# Patient Record
Sex: Male | Born: 1975 | Race: Black or African American | Hispanic: No | Marital: Single | Smoking: Current every day smoker
Health system: Southern US, Community
[De-identification: ages and names within clinical notes are randomized; demographics above are authoritative.]

## PROBLEM LIST (undated history)

## (undated) DIAGNOSIS — I10 Essential (primary) hypertension: Secondary | ICD-10-CM

---

## 2003-12-11 ENCOUNTER — Emergency Department: Payer: Self-pay | Admitting: Internal Medicine

## 2004-02-15 ENCOUNTER — Emergency Department: Payer: Self-pay | Admitting: Internal Medicine

## 2004-02-17 ENCOUNTER — Emergency Department: Payer: Self-pay | Admitting: Emergency Medicine

## 2004-06-08 ENCOUNTER — Emergency Department: Payer: Self-pay | Admitting: Emergency Medicine

## 2004-07-28 ENCOUNTER — Emergency Department: Payer: Self-pay | Admitting: General Practice

## 2004-07-28 ENCOUNTER — Other Ambulatory Visit: Payer: Self-pay

## 2005-11-09 ENCOUNTER — Emergency Department (HOSPITAL_COMMUNITY): Admission: EM | Admit: 2005-11-09 | Discharge: 2005-11-10 | Payer: Self-pay | Admitting: Emergency Medicine

## 2006-01-10 ENCOUNTER — Emergency Department: Payer: Self-pay | Admitting: Emergency Medicine

## 2006-02-21 ENCOUNTER — Emergency Department: Payer: Self-pay | Admitting: Emergency Medicine

## 2007-08-10 ENCOUNTER — Emergency Department: Payer: Self-pay | Admitting: Emergency Medicine

## 2008-06-09 ENCOUNTER — Emergency Department: Payer: Self-pay | Admitting: Unknown Physician Specialty

## 2008-10-19 ENCOUNTER — Emergency Department: Payer: Self-pay | Admitting: Emergency Medicine

## 2009-12-12 ENCOUNTER — Emergency Department: Payer: Self-pay | Admitting: Emergency Medicine

## 2009-12-15 ENCOUNTER — Emergency Department: Payer: Self-pay | Admitting: Emergency Medicine

## 2009-12-20 ENCOUNTER — Emergency Department: Payer: Self-pay | Admitting: Emergency Medicine

## 2010-05-30 ENCOUNTER — Emergency Department: Payer: Self-pay | Admitting: Emergency Medicine

## 2010-09-24 ENCOUNTER — Emergency Department: Payer: Self-pay

## 2011-11-04 ENCOUNTER — Emergency Department (HOSPITAL_COMMUNITY)
Admission: EM | Admit: 2011-11-04 | Discharge: 2011-11-05 | Disposition: A | Discharge status: Home / Self Care | Payer: Self-pay | Attending: Emergency Medicine | Admitting: Emergency Medicine

## 2011-11-04 ENCOUNTER — Encounter (HOSPITAL_COMMUNITY): Payer: Self-pay | Admitting: Emergency Medicine

## 2011-11-04 DIAGNOSIS — J029 Acute pharyngitis, unspecified: Secondary | ICD-10-CM | POA: Insufficient documentation

## 2011-11-04 DIAGNOSIS — L988 Other specified disorders of the skin and subcutaneous tissue: Secondary | ICD-10-CM | POA: Insufficient documentation

## 2011-11-04 DIAGNOSIS — E876 Hypokalemia: Secondary | ICD-10-CM | POA: Insufficient documentation

## 2011-11-04 DIAGNOSIS — N342 Other urethritis: Secondary | ICD-10-CM | POA: Insufficient documentation

## 2011-11-04 DIAGNOSIS — I10 Essential (primary) hypertension: Secondary | ICD-10-CM | POA: Insufficient documentation

## 2011-11-04 DIAGNOSIS — Z79899 Other long term (current) drug therapy: Secondary | ICD-10-CM | POA: Insufficient documentation

## 2011-11-04 DIAGNOSIS — R112 Nausea with vomiting, unspecified: Secondary | ICD-10-CM | POA: Insufficient documentation

## 2011-11-04 DIAGNOSIS — R197 Diarrhea, unspecified: Secondary | ICD-10-CM | POA: Insufficient documentation

## 2011-11-04 DIAGNOSIS — R369 Urethral discharge, unspecified: Secondary | ICD-10-CM | POA: Insufficient documentation

## 2011-11-04 HISTORY — DX: Essential (primary) hypertension: I10

## 2011-11-04 NOTE — ED Notes (Signed)
Pt reports since Monday having nausea, vomited on Monday, but has not since, also reports diarrhea; denies abd pain or any other pain

## 2011-11-05 LAB — RAPID STREP SCREEN (MED CTR MEBANE ONLY): Streptococcus, Group A Screen (Direct): NEGATIVE

## 2011-11-05 LAB — CBC WITH DIFFERENTIAL/PLATELET
Basophils Relative: 0 % (ref 0–1)
Eosinophils Absolute: 0 10*3/uL (ref 0.0–0.7)
HCT: 41.2 % (ref 39.0–52.0)
Hemoglobin: 14.3 g/dL (ref 13.0–17.0)
MCH: 32.9 pg (ref 26.0–34.0)
MCHC: 34.7 g/dL (ref 30.0–36.0)
Monocytes Absolute: 0.4 10*3/uL (ref 0.1–1.0)
Monocytes Relative: 6 % (ref 3–12)
Neutro Abs: 4.2 10*3/uL (ref 1.7–7.7)

## 2011-11-05 LAB — URINALYSIS, ROUTINE W REFLEX MICROSCOPIC
Glucose, UA: NEGATIVE mg/dL
Ketones, ur: 15 mg/dL — AB
pH: 6 (ref 5.0–8.0)

## 2011-11-05 LAB — COMPREHENSIVE METABOLIC PANEL
AST: 15 U/L (ref 0–37)
Albumin: 3.5 g/dL (ref 3.5–5.2)
Alkaline Phosphatase: 76 U/L (ref 39–117)
Chloride: 104 mEq/L (ref 96–112)
Potassium: 3 mEq/L — ABNORMAL LOW (ref 3.5–5.1)
Total Bilirubin: 0.8 mg/dL (ref 0.3–1.2)

## 2011-11-05 LAB — URINE MICROSCOPIC-ADD ON

## 2011-11-05 MED ORDER — DEXTROSE 5 % IV SOLN
1.0000 g | Freq: Once | INTRAVENOUS | Status: AC
  Administered 2011-11-05: 1 g via INTRAVENOUS
  Filled 2011-11-05: qty 10

## 2011-11-05 MED ORDER — SODIUM CHLORIDE 0.9 % IV BOLUS (SEPSIS)
1000.0000 mL | Freq: Once | INTRAVENOUS | Status: AC
  Administered 2011-11-05: 1000 mL via INTRAVENOUS

## 2011-11-05 MED ORDER — ONDANSETRON HCL 4 MG PO TABS: 4.0000 mg | ORAL_TABLET | Freq: Four times a day (QID) | ORAL | Status: DC

## 2011-11-05 MED ORDER — POTASSIUM CHLORIDE CRYS ER 20 MEQ PO TBCR
40.0000 meq | EXTENDED_RELEASE_TABLET | Freq: Once | ORAL | Status: AC
  Administered 2011-11-05: 40 meq via ORAL
  Filled 2011-11-05: qty 2

## 2011-11-05 MED ORDER — ONDANSETRON HCL 4 MG/2ML IJ SOLN
4.0000 mg | Freq: Once | INTRAMUSCULAR | Status: AC
  Administered 2011-11-05: 4 mg via INTRAVENOUS
  Filled 2011-11-05: qty 2

## 2011-11-05 MED ORDER — AZITHROMYCIN 1 G PO PACK
1.0000 g | PACK | Freq: Once | ORAL | Status: AC
  Administered 2011-11-05: 1 g via ORAL
  Filled 2011-11-05: qty 1

## 2011-11-05 NOTE — ED Notes (Signed)
The patient is AOx4 and comfortable with his discharge instructions. 

## 2011-11-05 NOTE — ED Provider Notes (Signed)
History     CSN: [id number]  Arrival date & time 11/04/11  2312   First MD Initiated Contact with Patient 11/04/11 2349      Chief Complaint  Patient presents with  . Emesis    (Consider location/radiation/quality/duration/timing/severity/associated sxs/prior treatment) HPI 36 year male presents to emergency room complaining of nausea vomiting and diarrhea since Monday. He denies any travel, no unusual foods, no fever no abdominal pain. He has had some sore throat and penile discharge. Patient recently engaged in sexual contact, and thinks he might have a STD. No prior history of STI's, HIV. He denies any pain with urination. Patient has been unable take his medications for the last few days do to nausea. Patient reports he feels dehydrated, reports his urine has been dark.  Past Medical History  Diagnosis Date  . Hypertension     History reviewed. No pertinent past surgical history.  History reviewed. No pertinent family history.  History  Substance Use Topics  . Smoking status: Current Every Day Smoker -- 0.2 packs/day    Types: Cigarettes  . Smokeless tobacco: Not on file  . Alcohol Use: Yes     two times per week      Review of Systems  All other systems reviewed and are negative.    Allergies  Review of patient's allergies indicates no known allergies.  Home Medications   Current Outpatient Rx  Name Route Sig Dispense Refill  . HYDROCHLOROTHIAZIDE 25 MG PO TABS Oral Take 25 mg by mouth daily.    Marland Kitchen LISINOPRIL 40 MG PO TABS Oral Take 40 mg by mouth daily.    Marland Kitchen OMEPRAZOLE 20 MG PO CPDR Oral Take 20 mg by mouth 2 (two) times daily.    Marland Kitchen ONDANSETRON HCL 4 MG PO TABS Oral Take 1 tablet (4 mg total) by mouth every 6 (six) hours. PRN nausea 12 tablet 0    BP 129/81  Pulse 76  Temp 97.6 F (36.4 C) (Oral)  Resp 17  Ht 5\' 6"  (1.676 m)  Wt 184 lb (83.462 kg)  BMI 29.70 kg/m2  SpO2 97%  Physical Exam  Nursing note and vitals reviewed. Constitutional: He is  oriented to person, place, and time. He appears well-developed and well-nourished.  HENT:  Head: Normocephalic and atraumatic.  Nose: Nose normal.  Mouth/Throat: Oropharynx is clear and moist. No oropharyngeal exudate (No exudate, but posterior pharynx is erythematous, no vesicles seen).  Eyes: Conjunctivae normal and EOM are normal. Pupils are equal, round, and reactive to light.  Neck: Normal range of motion. Neck supple. No JVD present. No tracheal deviation present. No thyromegaly present.  Cardiovascular: Normal rate, regular rhythm, normal heart sounds and intact distal pulses.  Exam reveals no gallop and no friction rub.   No murmur heard. Pulmonary/Chest: Effort normal and breath sounds normal. No stridor. No respiratory distress. He has no wheezes. He has no rales. He exhibits no tenderness.  Abdominal: Soft. Bowel sounds are normal. He exhibits no distension and no mass. There is no tenderness. There is no rebound and no guarding.  Genitourinary: No penile tenderness.       Yellow white discharge noted from penis. Nonpainful papule noted to head of penis. Patient is unsure how long it has been present.  Musculoskeletal: Normal range of motion. He exhibits no edema and no tenderness.  Lymphadenopathy:    He has no cervical adenopathy.  Neurological: He is alert and oriented to person, place, and time. He exhibits normal muscle tone. Coordination  normal.  Skin: Skin is warm and dry. No rash noted. No erythema. No pallor.  Psychiatric: He has a normal mood and affect. His behavior is normal. Judgment and thought content normal.    ED Course  Procedures (including critical care time)  Labs Reviewed  COMPREHENSIVE METABOLIC PANEL - Abnormal; Notable for the following:    Potassium 3.0 (*)     Total Protein 8.6 (*)     GFR calc non Af Amer 74 (*)     GFR calc Af Amer 86 (*)     All other components within normal limits  URINALYSIS, ROUTINE W REFLEX MICROSCOPIC - Abnormal; Notable  for the following:    Color, Urine AMBER (*)  BIOCHEMICALS MAY BE AFFECTED BY COLOR   APPearance CLOUDY (*)     Specific Gravity, Urine 1.038 (*)     Hgb urine dipstick TRACE (*)     Bilirubin Urine SMALL (*)     Ketones, ur 15 (*)     Protein, ur 30 (*)     Leukocytes, UA MODERATE (*)     All other components within normal limits  URINE MICROSCOPIC-ADD ON - Abnormal; Notable for the following:    Squamous Epithelial / LPF FEW (*)     Bacteria, UA FEW (*)     All other components within normal limits  LIPASE, BLOOD  CBC WITH DIFFERENTIAL  RAPID STREP SCREEN  GC/CHLAMYDIA PROBE AMP, GENITAL  GONOCOCCUS CULTURE  URINE CULTURE   No results found.   1. Nausea vomiting and diarrhea   2. Hypokalemia   3. Urethritis, unspecified   4. Pharyngitis   5. Hypertension       MDM  36 year old male with nausea vomiting diarrhea sore throat and penile discharge. Concern for possible STI. Patient had penile and throat swab. These were sent for gonorrhea culture and GC chlamydia PCR. Lab work is reassuring. Urine was sent for culture, but I feel findings are due to urethritis. He has been treated with Rocephin and azithromycin. He has been instructed to followup with the health department for further testing for other STI's. He has tolerated by mouth without difficulty.        Olivia Mackie, MD 11/05/11 [phone]

## 2011-11-06 LAB — URINE CULTURE: Culture: NO GROWTH

## 2011-11-07 ENCOUNTER — Telehealth (HOSPITAL_COMMUNITY): Payer: Self-pay | Admitting: Emergency Medicine

## 2011-11-07 NOTE — ED Notes (Signed)
+  Gonorrhea. Patient treated with Rocephin and Zithromax. Per protocol MD. [employer] faxed. °

## 2011-11-08 ENCOUNTER — Telehealth (HOSPITAL_COMMUNITY): Payer: Self-pay | Admitting: Emergency Medicine

## 2011-11-11 LAB — GONOCOCCUS CULTURE

## 2012-05-22 ENCOUNTER — Emergency Department: Payer: Self-pay | Admitting: Emergency Medicine

## 2012-05-22 LAB — CBC WITH DIFFERENTIAL/PLATELET
Basophil #: 0 10*3/uL (ref 0.0–0.1)
Eosinophil %: 1 %
HCT: 36.6 % — ABNORMAL LOW (ref 40.0–52.0)
HGB: 12.6 g/dL — ABNORMAL LOW (ref 13.0–18.0)
Lymphocyte #: 1.7 10*3/uL (ref 1.0–3.6)
Lymphocyte %: 24.1 %
MCHC: 34.4 g/dL (ref 32.0–36.0)
MCV: 97 fL (ref 80–100)
Monocyte #: 0.5 x10 3/mm (ref 0.2–1.0)
Neutrophil %: 67.7 %
Platelet: 232 10*3/uL (ref 150–440)
RBC: 3.77 10*6/uL — ABNORMAL LOW (ref 4.40–5.90)
WBC: 6.9 10*3/uL (ref 3.8–10.6)

## 2018-04-02 ENCOUNTER — Encounter (HOSPITAL_COMMUNITY): Payer: Self-pay

## 2018-04-02 ENCOUNTER — Other Ambulatory Visit: Payer: Self-pay

## 2018-04-02 ENCOUNTER — Ambulatory Visit (INDEPENDENT_AMBULATORY_CARE_PROVIDER_SITE_OTHER): Payer: BLUE CROSS/BLUE SHIELD

## 2018-04-02 ENCOUNTER — Ambulatory Visit (HOSPITAL_COMMUNITY)
Admission: EM | Admit: 2018-04-02 | Discharge: 2018-04-02 | Disposition: A | Discharge status: Home / Self Care | Payer: BLUE CROSS/BLUE SHIELD | Attending: Family Medicine | Admitting: Family Medicine

## 2018-04-02 DIAGNOSIS — S4992XA Unspecified injury of left shoulder and upper arm, initial encounter: Secondary | ICD-10-CM

## 2018-04-02 MED ORDER — DICLOFENAC SODIUM 75 MG PO TBEC: 75.0000 mg | DELAYED_RELEASE_TABLET | Freq: Two times a day (BID) | ORAL | 0 refills | Status: DC

## 2018-04-02 NOTE — ED Triage Notes (Signed)
Pt cc he fell and dislocated his shoulder ( left ) this happened. Pt states he slipped and fell in the laundry room.

## 2018-04-04 NOTE — ED Provider Notes (Signed)
[facility]ARE [facility]ENTER   [id number] 04/02/18 Arrival Time: 1636  ASSESSMENT & PLAN:  1. Shoulder injury, left, initial encounter    I have personally viewed the imaging studies ordered this visit. No fracture or shoulder dislocation seen.  Meds ordered this encounter  Medications  . diclofenac (VOLTAREN) 75 MG EC tablet    Sig: Take 1 tablet (75 mg total) by mouth 2 (two) times daily.    Dispense:  14 tablet    Refill:  0    Orders Placed This Encounter  Procedures  . DG Shoulder Left    Follow-up Information    Schedule an appointment as soon as possible for a visit  with [facility].   Specialty:  Specialist Contact information: [address] [place] [address] [phone]          Reviewed expectations re: course of current medical issues. Questions answered. Outlined signs and symptoms indicating need for more acute intervention. Patient verbalized understanding. After Visit Summary given.  SUBJECTIVE: History from: patient. Cody Johnson is a 43 y.o. male who reports fairly persistent mild to moderate pain of his left shoulder; described as aching without radiation. Onset: abrupt, today. Injury/trama: reports falling in his home, hitting left shoulder on wall/floor; h/o shoulder dislocation; "just wanted to make sure I didn't dislocate it again"; is having trouble lifting his left arm above his shoulder Symptoms have progressed to a point and plateaued since beginning. Aggravating factors: movement. Alleviating factors: rest. Associated symptoms: none reported. Extremity sensation changes or weakness: none. Self treatment: has not tried OTCs for relief of pain. History of similar: reports h/o left shoulder dislocation; no surgery required.  ROS: As per HPI.   OBJECTIVE:  Vitals:   04/02/18 1659 04/02/18 1701  BP: (!) 131/115   Pulse: 90   Resp: 18   Temp: 97.9 F (36.6 C)   TempSrc: Oral   SpO2: 98%   Weight:  97.5 kg    General appearance: alert; no distress Extremities: . LUE: warm and well perfused; poorly localized mild to moderate tenderness over left shoulder without specific bony tenderness; without gross deformities; with no swelling; with no bruising; ROM: limited by pain, esp with abduction CV: brisk extremity capillary refill of RUE and LUE; 2+ radial pulse of LUE. Skin: warm and dry; no visible rashes Neurologic: gait normal; normal reflexes of RUE and LUE; normal sensation of RUE and LUE; normal strength of RUE and LUE Psychological: alert and cooperative; normal mood and affect  Imaging: Dg Shoulder Left  Result Date: 04/02/2018 CLINICAL DATA:  Larey Seat and injured left shoulder. EXAM: LEFT SHOULDER - 2+ VIEW COMPARISON:  None. FINDINGS: The joint spaces are maintained. Mild/early glenohumeral joint degenerative changes. No acute fracture or dislocation. Subchondral cystic changes noted in the humeral head. There is a calcification in the region the axillary recess or possibly associated with the biceps tendon. The visualized left ribs are intact and the visualized left lung is clear. IMPRESSION: No fracture or dislocation. Electronically Signed   By: Rudie Meyer M.D.   On: 04/02/2018 17:52   No Known Allergies  Past Medical History:  Diagnosis Date  . Hypertension    Social History   Socioeconomic History  . Marital status: Single    Spouse name: Not on file  . Number of children: Not on file  . Years of education: Not on file  . Highest education level: Not on file  Occupational History  . Not on file  Social Needs  .  Financial resource strain: Not on file  . Food insecurity:    Worry: Not on file    Inability: Not on file  . Transportation needs:    Medical: Not on file    Non-medical: Not on file  Tobacco Use  . Smoking status: Current Every Day Smoker    Packs/day: 0.25    Types: Cigarettes  . Smokeless tobacco: Never Used  Substance and Sexual Activity  . Alcohol use:  Yes    Comment: two times per week  . Drug use: Yes    Types: Marijuana  . Sexual activity: Not on file  Lifestyle  . Physical activity:    Days per week: Not on file    Minutes per session: Not on file  . Stress: Not on file  Relationships  . Social connections:    Talks on phone: Not on file    Gets together: Not on file    Attends religious service: Not on file    Active member of club or organization: Not on file    Attends meetings of clubs or organizations: Not on file    Relationship status: Not on file  Other Topics Concern  . Not on file  Social History Narrative  . Not on file   History reviewed. No pertinent family history. History reviewed. No pertinent surgical history.    Mardella Layman, MD 04/06/18 [phone]

## 2020-02-01 ENCOUNTER — Ambulatory Visit
Admission: RE | Admit: 2020-02-01 | Discharge: 2020-02-01 | Disposition: A | Discharge status: Home / Self Care | Payer: 59 | Source: Ambulatory Visit | Attending: Medical | Admitting: Medical

## 2020-02-01 ENCOUNTER — Other Ambulatory Visit: Payer: Self-pay | Admitting: Medical

## 2020-02-01 DIAGNOSIS — R059 Cough, unspecified: Secondary | ICD-10-CM

## 2020-11-05 ENCOUNTER — Other Ambulatory Visit: Payer: Self-pay

## 2020-11-05 ENCOUNTER — Emergency Department (HOSPITAL_BASED_OUTPATIENT_CLINIC_OR_DEPARTMENT_OTHER): Payer: 59

## 2020-11-05 ENCOUNTER — Encounter (HOSPITAL_BASED_OUTPATIENT_CLINIC_OR_DEPARTMENT_OTHER): Payer: Self-pay

## 2020-11-05 ENCOUNTER — Emergency Department (HOSPITAL_BASED_OUTPATIENT_CLINIC_OR_DEPARTMENT_OTHER)
Admission: EM | Admit: 2020-11-05 | Discharge: 2020-11-05 | Disposition: A | Discharge status: Home / Self Care | Payer: 59 | Attending: Emergency Medicine | Admitting: Emergency Medicine

## 2020-11-05 DIAGNOSIS — F17210 Nicotine dependence, cigarettes, uncomplicated: Secondary | ICD-10-CM | POA: Insufficient documentation

## 2020-11-05 DIAGNOSIS — I10 Essential (primary) hypertension: Secondary | ICD-10-CM | POA: Insufficient documentation

## 2020-11-05 DIAGNOSIS — Z21 Asymptomatic human immunodeficiency virus [HIV] infection status: Secondary | ICD-10-CM | POA: Insufficient documentation

## 2020-11-05 DIAGNOSIS — R112 Nausea with vomiting, unspecified: Secondary | ICD-10-CM | POA: Insufficient documentation

## 2020-11-05 DIAGNOSIS — K5900 Constipation, unspecified: Secondary | ICD-10-CM | POA: Diagnosis not present

## 2020-11-05 DIAGNOSIS — R109 Unspecified abdominal pain: Secondary | ICD-10-CM | POA: Diagnosis present

## 2020-11-05 DIAGNOSIS — Z79899 Other long term (current) drug therapy: Secondary | ICD-10-CM | POA: Diagnosis not present

## 2020-11-05 DIAGNOSIS — Z20822 Contact with and (suspected) exposure to covid-19: Secondary | ICD-10-CM | POA: Diagnosis not present

## 2020-11-05 DIAGNOSIS — K5732 Diverticulitis of large intestine without perforation or abscess without bleeding: Secondary | ICD-10-CM | POA: Diagnosis not present

## 2020-11-05 DIAGNOSIS — K5792 Diverticulitis of intestine, part unspecified, without perforation or abscess without bleeding: Secondary | ICD-10-CM

## 2020-11-05 LAB — URINALYSIS, ROUTINE W REFLEX MICROSCOPIC
Bilirubin Urine: NEGATIVE
Glucose, UA: NEGATIVE mg/dL
Ketones, ur: 15 mg/dL — AB
Leukocytes,Ua: NEGATIVE
Nitrite: NEGATIVE
Protein, ur: NEGATIVE mg/dL
Specific Gravity, Urine: 1.02 (ref 1.005–1.030)
pH: 7 (ref 5.0–8.0)

## 2020-11-05 LAB — BASIC METABOLIC PANEL
Anion gap: 7 (ref 5–15)
BUN: 11 mg/dL (ref 6–20)
CO2: 27 mmol/L (ref 22–32)
Calcium: 9 mg/dL (ref 8.9–10.3)
Chloride: 99 mmol/L (ref 98–111)
Creatinine, Ser: 1.15 mg/dL (ref 0.61–1.24)
GFR, Estimated: >60 mL/min (ref >60)
Glucose, Bld: 92 mg/dL (ref 70–99)
Potassium: 4 mmol/L (ref 3.5–5.1)
Sodium: 133 mmol/L — ABNORMAL LOW (ref 135–145)

## 2020-11-05 LAB — CBC WITH DIFFERENTIAL/PLATELET
Abs Immature Granulocytes: 0.03 10*3/uL (ref 0.00–0.07)
Basophils Absolute: 0 10*3/uL (ref 0.0–0.1)
Basophils Relative: 0 %
Eosinophils Absolute: 0.1 10*3/uL (ref 0.0–0.5)
Eosinophils Relative: 1 %
HCT: 44.6 % (ref 39.0–52.0)
Hemoglobin: 14.8 g/dL (ref 13.0–17.0)
Immature Granulocytes: 0 %
Lymphocytes Relative: 14 %
Lymphs Abs: 1.9 10*3/uL (ref 0.7–4.0)
MCH: 34.2 pg — ABNORMAL HIGH (ref 26.0–34.0)
MCHC: 33.2 g/dL (ref 30.0–36.0)
MCV: 103 fL — ABNORMAL HIGH (ref 80.0–100.0)
Monocytes Absolute: 0.9 10*3/uL (ref 0.1–1.0)
Monocytes Relative: 7 %
Neutro Abs: 10.1 10*3/uL — ABNORMAL HIGH (ref 1.7–7.7)
Neutrophils Relative %: 78 %
Platelets: 244 10*3/uL (ref 150–400)
RBC: 4.33 MIL/uL (ref 4.22–5.81)
RDW: 11.8 % (ref 11.5–15.5)
WBC: 13.1 10*3/uL — ABNORMAL HIGH (ref 4.0–10.5)
nRBC: 0 % (ref 0.0–0.2)

## 2020-11-05 LAB — URINALYSIS, MICROSCOPIC (REFLEX)

## 2020-11-05 LAB — RESP PANEL BY RT-PCR (FLU A&B, COVID) ARPGX2
Influenza A by PCR: NEGATIVE
Influenza B by PCR: NEGATIVE
SARS Coronavirus 2 by RT PCR: NEGATIVE

## 2020-11-05 MED ORDER — OXYCODONE-ACETAMINOPHEN 5-325 MG PO TABS: 1.0000 | ORAL_TABLET | Freq: Three times a day (TID) | ORAL | 0 refills | Status: DC | PRN

## 2020-11-05 MED ORDER — ONDANSETRON 4 MG PO TBDP: 4.0000 mg | ORAL_TABLET | Freq: Three times a day (TID) | ORAL | 0 refills | Status: DC | PRN

## 2020-11-05 MED ORDER — IOHEXOL 300 MG/ML  SOLN
100.0000 mL | Freq: Once | INTRAMUSCULAR | Status: AC | PRN
  Administered 2020-11-05: 100 mL via INTRAVENOUS

## 2020-11-05 MED ORDER — POLYETHYLENE GLYCOL 3350 17 G PO PACK: 17.0000 g | PACK | Freq: Every day | ORAL | 0 refills | Status: DC | PRN

## 2020-11-05 MED ORDER — AMOXICILLIN-POT CLAVULANATE 875-125 MG PO TABS: 1.0000 | ORAL_TABLET | Freq: Two times a day (BID) | ORAL | 0 refills | Status: DC

## 2020-11-05 NOTE — ED Notes (Signed)
Having abd pain at area below umbilicus, has had some nausea, all onset yesterday. No vomiting per pt statement. Urine test has resulted.

## 2020-11-05 NOTE — ED Provider Notes (Signed)
[facility] EMERGENCY DEPARTMENT Provider Note   CSN: [id number] Arrival date & time: 11/05/20  0840     History Chief Complaint  Patient presents with   Abdominal Pain    Cody Johnson is a 45 y.o. male.   Abdominal Pain Associated symptoms: constipation, fever, nausea and vomiting   Associated symptoms: no chest pain and no shortness of breath   Patient with lower abdominal pain.  Began left lower quadrant now more in the mid abdomen.  Some nausea.  Some mild constipation's yesterday.  States he has had some fevers.  No known sick contacts.  Is not had pain like this before.  Does have history of HIV but nondetectable viral load and states his CD4 count has been good.  States has felt as if he had a little difficulty urinating.    Past Medical History:  Diagnosis Date   Hypertension   Also with hepatitis and HIV.  There are no problems to display for this patient.   History reviewed. No pertinent surgical history.     History reviewed. No pertinent family history.  Social History   Tobacco Use   Smoking status: Every Day    Packs/day: 0.25    Types: Cigarettes   Smokeless tobacco: Never  Vaping Use   Vaping Use: Never used  Substance Use Topics   Alcohol use: Yes    Comment: two times per week   Drug use: Yes    Types: Marijuana    Home Medications Prior to Admission medications   Medication Sig Start Date End Date Taking? Authorizing Provider  amoxicillin-clavulanate (AUGMENTIN) 875-125 MG tablet Take 1 tablet by mouth every 12 (twelve) hours. 11/05/20  Yes Benjiman Core, MD  ondansetron (ZOFRAN-ODT) 4 MG disintegrating tablet Take 1 tablet (4 mg total) by mouth every 8 (eight) hours as needed for nausea or vomiting. 11/05/20  Yes Benjiman Core, MD  oxyCODONE-acetaminophen (PERCOCET/ROXICET) 5-325 MG tablet Take 1-2 tablets by mouth every 8 (eight) hours as needed for severe pain. 11/05/20  Yes Benjiman Core, MD  polyethylene glycol  (MIRALAX / GLYCOLAX) 17 g packet Take 17 g by mouth daily as needed. While dealing with the diverticulitis or taking the pain medicine 11/05/20  Yes Benjiman Core, MD  diclofenac (VOLTAREN) 75 MG EC tablet Take 1 tablet (75 mg total) by mouth 2 (two) times daily. 04/02/18   Mardella Layman, MD  hydrochlorothiazide (HYDRODIURIL) 25 MG tablet Take 25 mg by mouth daily.    [provider]  lisinopril (PRINIVIL,ZESTRIL) 40 MG tablet Take 40 mg by mouth daily.    [provider]  omeprazole (PRILOSEC) 20 MG capsule Take 20 mg by mouth 2 (two) times daily.    [provider]  ondansetron (ZOFRAN) 4 MG tablet Take 1 tablet (4 mg total) by mouth every 6 (six) hours. PRN nausea 11/05/11   Marisa Severin, MD    Allergies    Patient has no known allergies.  Review of Systems   Review of Systems  Constitutional:  Positive for appetite change and fever.  HENT:  Negative for congestion.   Respiratory:  Negative for shortness of breath.   Cardiovascular:  Negative for chest pain.  Gastrointestinal:  Positive for abdominal pain, constipation, nausea and vomiting.  Genitourinary:  Positive for difficulty urinating. Negative for genital sores.  Musculoskeletal:  Negative for back pain.  Skin:  Negative for rash.  Neurological:  Negative for seizures.  Psychiatric/Behavioral:  Negative for confusion.    Physical Exam Updated  Vital Signs BP (!) 138/95 Comment: nurse notified  Pulse 85   Temp 98.6 F (37 C) (Oral)   Resp 20   Ht 5\' 5"  (1.651 m)   Wt 95.3 kg   SpO2 99%   BMI 34.95 kg/m   Physical Exam Vitals and nursing note reviewed.  HENT:     Head: Normocephalic.  Eyes:     Pupils: Pupils are equal, round, and reactive to light.  Cardiovascular:     Rate and Rhythm: Regular rhythm.  Pulmonary:     Breath sounds: Normal breath sounds.  Abdominal:     Hernia: No hernia is present.     Comments: Lower abdominal tenderness.  Suprapubic right and lower quadrant.  No  rebound or guarding.  No hernia palpated.  Skin:    General: Skin is warm.     Capillary Refill: Capillary refill takes less than 2 seconds.  Neurological:     Mental Status: He is alert and oriented to person, place, and time.  Psychiatric:        Behavior: Behavior normal.    ED Results / Procedures / Treatments   Labs (all labs ordered are listed, but only abnormal results are displayed) Labs Reviewed  URINALYSIS, ROUTINE W REFLEX MICROSCOPIC - Abnormal; Notable for the following components:      Result Value   Color, Urine AMBER (*)    Hgb urine dipstick TRACE (*)    Ketones, ur 15 (*)    All other components within normal limits  URINALYSIS, MICROSCOPIC (REFLEX) - Abnormal; Notable for the following components:   Bacteria, UA RARE (*)    All other components within normal limits  CBC WITH DIFFERENTIAL/PLATELET - Abnormal; Notable for the following components:   WBC 13.1 (*)    MCV 103.0 (*)    MCH 34.2 (*)    Neutro Abs 10.1 (*)    All other components within normal limits  BASIC METABOLIC PANEL - Abnormal; Notable for the following components:   Sodium 133 (*)    All other components within normal limits  RESP PANEL BY RT-PCR (FLU A&B, COVID) ARPGX2    EKG None  Radiology CT ABDOMEN PELVIS W CONTRAST  Result Date: 11/05/2020 CLINICAL DATA:  Left lower quadrant abdominal pain. Rectal pressure and urinary urgency. EXAM: CT ABDOMEN AND PELVIS WITH CONTRAST TECHNIQUE: Multidetector CT imaging of the abdomen and pelvis was performed using the standard protocol following bolus administration of intravenous contrast. CONTRAST:  01/05/2021 OMNIPAQUE IOHEXOL 300 MG/ML  SOLN COMPARISON:  None. FINDINGS: Lower chest: No acute abnormality. Hepatobiliary: No focal liver abnormality is seen. No gallstones, gallbladder wall thickening, or biliary dilatation. Pancreas: Unremarkable. No pancreatic ductal dilatation or surrounding inflammatory changes. Spleen: Normal in size without focal  abnormality. Adrenals/Urinary Tract: Adrenal glands are unremarkable. Kidneys are normal, without renal calculi, focal lesion, or hydronephrosis. Bladder is unremarkable. Stomach/Bowel: Stomach normal. The appendix is visualized and is unremarkable. No small bowel wall thickening, inflammation or distension. There is wall thickening with surrounding inflammatory fat stranding involving the proximal sigmoid colon with epicenter around a small inflamed diverticula. Imaging findings compatible with acute diverticulitis. There is no significant free fluid. No fluid collection identified. No pneumoperitoneum identified. Vascular/Lymphatic: No significant vascular findings are present. Prominent bilateral inguinal lymph nodes are identified including 1 cm right inguinal lymph node, image 86/2. No enlarged abdominal or iliac lymph nodes. Reproductive: The prostate gland appears heterogeneous with focal area of low attenuation within the posterior gland measuring 1.1 cm. The seminal  vesicles appear edematous bilaterally. Other: No free fluid or fluid collections. Musculoskeletal: No acute or significant osseous findings. IMPRESSION: 1. Examination is positive for acute sigmoid diverticulitis. No perforation or abscess identified. 2. Heterogeneous appearance of the prostate gland with focal area of low attenuation within the posterior gland measuring 1.1 cm. Correlate for any clinical signs or symptoms of prostatitis. 3. Borderline enlarged right inguinal lymph nodes are favored to represent reactive adenopathy. Electronically Signed   By: Signa Kell M.D.   On: 11/05/2020 14:35    Procedures Procedures   Medications Ordered in ED Medications  iohexol (OMNIPAQUE) 300 MG/ML solution 100 mL (100 mLs Intravenous Contrast Given 11/05/20 1357)    ED Course  I have reviewed the triage vital signs and the nursing notes.  Pertinent labs & imaging results that were available during my care of the patient were reviewed  by me and considered in my medical decision making (see chart for details).    MDM Rules/Calculators/A&P                           Patient with abdominal pain.  White count mildly elevated.  Afebrile here but states he has had fevers at home.  Abdominal tenderness.  CT scan done and showed diverticulitis.  States he thinks he can tolerate orals and feels the pain at home.  We will treat symptomatically with pain meds nausea medicine and stool softeners.  We will also give antibiotics.  Some abnormalities of the prostate that can be followed as an outpatient.  Could be reactive.  Urine did not show definite infection.  Reviewing records appears patient is also HIV positive but states he is a good CD4 count.  Viral load is undetectable. Final Clinical Impression(s) / ED Diagnoses Final diagnoses:  Diverticulitis    Rx / DC Orders ED Discharge Orders          Ordered    amoxicillin-clavulanate (AUGMENTIN) 875-125 MG tablet  Every 12 hours        11/05/20 1511    ondansetron (ZOFRAN-ODT) 4 MG disintegrating tablet  Every 8 hours PRN        11/05/20 1513    oxyCODONE-acetaminophen (PERCOCET/ROXICET) 5-325 MG tablet  Every 8 hours PRN        11/05/20 1513    polyethylene glycol (MIRALAX / GLYCOLAX) 17 g packet  Daily PRN        11/05/20 1513             Benjiman Core, MD 11/05/20 1516

## 2020-11-05 NOTE — ED Triage Notes (Addendum)
Pt c/o lower abdominal pain and fever since yesterday. States rectal pressure and urinary urgency. Constipation since yesterday. Took laxative Sunday with minimal relief. Some nausea.

## 2020-11-05 NOTE — Discharge Instructions (Addendum)
Follow-up with your primary care doctor as needed.  If symptoms do not improve or worsen return to the ER for further evaluation.  Your prostate also looked a little abnormal.  Follow-up with your doctors for this.

## 2021-07-06 ENCOUNTER — Ambulatory Visit
Admission: EM | Admit: 2021-07-06 | Discharge: 2021-07-06 | Disposition: A | Discharge status: Home / Self Care | Payer: 59 | Attending: Emergency Medicine | Admitting: Emergency Medicine

## 2021-07-06 DIAGNOSIS — A084 Viral intestinal infection, unspecified: Secondary | ICD-10-CM

## 2021-07-06 DIAGNOSIS — K219 Gastro-esophageal reflux disease without esophagitis: Secondary | ICD-10-CM

## 2021-07-06 MED ORDER — ONDANSETRON HCL 4 MG/2ML IJ SOLN
8.0000 mg | Freq: Once | INTRAMUSCULAR | Status: AC
  Administered 2021-07-06: 8 mg via INTRAMUSCULAR

## 2021-07-06 MED ORDER — LANSOPRAZOLE 30 MG PO CPDR: 30.0000 mg | DELAYED_RELEASE_CAPSULE | Freq: Every morning | ORAL | 2 refills | Status: AC

## 2021-07-06 MED ORDER — LOPERAMIDE HCL 2 MG PO TABS: 4.0000 mg | ORAL_TABLET | Freq: Four times a day (QID) | ORAL | 0 refills | Status: AC | PRN

## 2021-07-06 MED ORDER — ONDANSETRON 8 MG PO TBDP: 8.0000 mg | ORAL_TABLET | Freq: Three times a day (TID) | ORAL | 0 refills | Status: AC | PRN

## 2021-07-06 NOTE — ED Provider Notes (Signed)
[facility]    CSN: [id number] Arrival date & time: 07/06/21  1103    HISTORY   Chief Complaint  Patient presents with   Nausea   HPI Cody Johnson is a 46 y.o. male. Patient presents to urgent care today complaining of a 3-day history of nausea, vomiting and diarrhea.  Patient states in the past 24 hours he has vomited 3 times.  Patient endorses having 20 episodes of diarrhea in the past 24 hours.  Patient states he is now having cramping in his lower abdomen as well in his legs on the front.  Patient states that he has burped frequently and that his burps smell like rotten eggs.  Patient reports a history of GERD, was previously taking omeprazole 20 mg but the prescription ran out about 3 days ago.  Patient states he been attempting to drink electrolyte replacement and fluid with little success, states he is vomiting everything he is drinking.  Of note, patient has normal vital signs on arrival today and appears to be in no acute distress.  Patient is smiling, pleasant and interactive.  Also of note, when patient arrived to urgent care today, he was demanding IV fluid replacement and was belligerent to clinical staff when he was advised that we could not guarantee that we will provide IV fluids for him today.   The history is provided by the patient.  Past Medical History:  Diagnosis Date   Hypertension    There are no problems to display for this patient.  History reviewed. No pertinent surgical history.  Home Medications    Prior to Admission medications   Medication Sig Start Date End Date Taking? Authorizing Provider  lansoprazole (PREVACID) 30 MG capsule Take 1 capsule (30 mg total) by mouth in the morning. 30 minutes prior to breakfast meal 07/06/21 10/04/21 Yes Theadora Rama Scales, PA-C  loperamide (IMODIUM A-D) 2 MG tablet Take 2 tablets (4 mg total) by mouth 4 (four) times daily as needed for diarrhea or loose stools. 07/06/21  Yes Theadora Rama Scales, PA-C   ondansetron (ZOFRAN-ODT) 8 MG disintegrating tablet Take 1 tablet (8 mg total) by mouth every 8 (eight) hours as needed for nausea or vomiting. 07/06/21  Yes Theadora Rama Scales, PA-C  hydrochlorothiazide (HYDRODIURIL) 25 MG tablet Take 25 mg by mouth daily.    [provider]  lisinopril (PRINIVIL,ZESTRIL) 40 MG tablet Take 40 mg by mouth daily.    [provider]    Family History History reviewed. No pertinent family history. Social History Social History   Tobacco Use   Smoking status: Every Day    Packs/day: 0.25    Types: Cigarettes   Smokeless tobacco: Never  Vaping Use   Vaping Use: Never used  Substance Use Topics   Alcohol use: Yes    Comment: two times per week   Drug use: Yes    Types: Marijuana   Allergies   Patient has no known allergies.  Review of Systems Review of Systems Pertinent findings noted in history of present illness.   Physical Exam Triage Vital Signs ED Triage Vitals  Enc Vitals Group     BP 11/29/20 0827 (!) 147/82     Pulse Rate 11/29/20 0827 72     Resp 11/29/20 0827 18     Temp 11/29/20 0827 98.3 F (36.8 C)     Temp Source 11/29/20 0827 Oral     SpO2 11/29/20 0827 98 %     Weight --  Height --      Head Circumference --      Peak Flow --      Pain Score 11/29/20 0826 5     Pain Loc --      Pain Edu? --      Excl. in GC? --   Orthostatic VS for the past 24 hrs:  BP- Lying Pulse- Lying BP- Sitting Pulse- Sitting Pulse- Standing at 0 minutes  07/06/21 1152 130/88 97 (!) 122/93 94 107    Updated Vital Signs BP 112/85 (BP Location: Left Arm)   Pulse 93   Temp 98.4 F (36.9 C)   Resp 20   SpO2 94%   Physical Exam Vitals and nursing note reviewed.  Constitutional:      General: He is not in acute distress.    Appearance: Normal appearance. He is not ill-appearing.  HENT:     Head: Normocephalic and atraumatic.     Salivary Glands: Right salivary gland is not diffusely enlarged or tender. Left  salivary gland is not diffusely enlarged or tender.     Right Ear: Tympanic membrane, ear canal and external ear normal. No drainage. No middle ear effusion. There is no impacted cerumen. Tympanic membrane is not erythematous or bulging.     Left Ear: Tympanic membrane, ear canal and external ear normal. No drainage.  No middle ear effusion. There is no impacted cerumen. Tympanic membrane is not erythematous or bulging.     Nose: Nose normal. No nasal deformity, septal deviation, mucosal edema, congestion or rhinorrhea.     Right Turbinates: Not enlarged, swollen or pale.     Left Turbinates: Not enlarged, swollen or pale.     Right Sinus: No maxillary sinus tenderness or frontal sinus tenderness.     Left Sinus: No maxillary sinus tenderness or frontal sinus tenderness.     Mouth/Throat:     Lips: Pink. No lesions.     Mouth: Mucous membranes are moist. No oral lesions.     Pharynx: Oropharynx is clear. Uvula midline. No posterior oropharyngeal erythema or uvula swelling.     Tonsils: No tonsillar exudate. 0 on the right. 0 on the left.  Eyes:     General: Lids are normal.        Right eye: No discharge.        Left eye: No discharge.     Extraocular Movements: Extraocular movements intact.     Conjunctiva/sclera: Conjunctivae normal.     Right eye: Right conjunctiva is not injected.     Left eye: Left conjunctiva is not injected.  Neck:     Trachea: Trachea and phonation normal.  Cardiovascular:     Rate and Rhythm: Normal rate and regular rhythm.     Pulses: Normal pulses.     Heart sounds: Normal heart sounds. No murmur heard.   No friction rub. No gallop.  Pulmonary:     Effort: Pulmonary effort is normal. No accessory muscle usage, prolonged expiration or respiratory distress.     Breath sounds: Normal breath sounds. No stridor, decreased air movement or transmitted upper airway sounds. No decreased breath sounds, wheezing, rhonchi or rales.  Chest:     Chest wall: No  tenderness.  Abdominal:     General: Abdomen is flat. Bowel sounds are normal. There is no distension.     Palpations: Abdomen is soft.     Tenderness: There is no abdominal tenderness. There is no right CVA tenderness or left CVA tenderness.  Hernia: No hernia is present.  Musculoskeletal:        General: Normal range of motion.     Cervical back: Normal range of motion and neck supple. Normal range of motion.  Lymphadenopathy:     Cervical: No cervical adenopathy.  Skin:    General: Skin is warm and dry.     Findings: No erythema or rash.  Neurological:     General: No focal deficit present.     Mental Status: He is alert and oriented to person, place, and time.  Psychiatric:        Mood and Affect: Mood normal.        Behavior: Behavior normal.    Visual Acuity Right Eye Distance:   Left Eye Distance:   Bilateral Distance:    Right Eye Near:   Left Eye Near:    Bilateral Near:     [facility] Couse / Diagnostics / Procedures:    EKG  Radiology No results found.  Procedures Procedures (including critical care time)  UC Diagnoses / Final Clinical Impressions(s)   I have reviewed the triage vital signs and the nursing notes.  Pertinent labs & imaging results that were available during my care of the patient were reviewed by me and considered in my medical decision making (see chart for details).    Final diagnoses:  Viral gastroenteritis  Gastroesophageal reflux disease without esophagitis   During my interaction with the patient, he was smiling, interactive and pleasant, joking throughout the visit.  Vital signs stable on arrival, I see no indication for IV fluid replacement.  Patient was provided with prescriptions for Zofran and Imodium to control his symptoms until presumed viral gastroenteritis resolved.  Patient advised to push clear fluids, slowly add back bland foods and continue bland foods for another 24 hours after diarrhea and vomiting have ceased.  Patient  provided with a note to return to work.  ED Prescriptions     Medication Sig Dispense Auth. Provider   loperamide (IMODIUM A-D) 2 MG tablet Take 2 tablets (4 mg total) by mouth 4 (four) times daily as needed for diarrhea or loose stools. 30 tablet Theadora Rama Scales, PA-C   ondansetron (ZOFRAN-ODT) 8 MG disintegrating tablet Take 1 tablet (8 mg total) by mouth every 8 (eight) hours as needed for nausea or vomiting. 20 tablet Theadora Rama Scales, PA-C   lansoprazole (PREVACID) 30 MG capsule Take 1 capsule (30 mg total) by mouth in the morning. 30 minutes prior to breakfast meal 30 capsule Theadora Rama Scales, PA-C      PDMP not reviewed this encounter.  Pending results:  Labs Reviewed - No data to display  Medications Ordered in UC: Medications  ondansetron (ZOFRAN) injection 8 mg (8 mg Intramuscular Given 07/06/21 1212)    Disposition Upon Discharge:  Condition: stable for discharge home Home: take medications as prescribed; routine discharge instructions as discussed; follow up as advised.  Patient presented with an acute illness with associated systemic symptoms and significant discomfort requiring urgent management. In my opinion, this is a condition that a prudent lay person (someone who possesses an average knowledge of health and medicine) may potentially expect to result in complications if not addressed urgently such as respiratory distress, impairment of bodily function or dysfunction of bodily organs.   Routine symptom specific, illness specific and/or disease specific instructions were discussed with the patient and/or caregiver at length.   As such, the patient has been evaluated and assessed, work-up was performed and treatment was  provided in alignment with urgent care protocols and evidence based medicine.  Patient/parent/caregiver has been advised that the patient may require follow up for further testing and treatment if the symptoms continue in spite of  treatment, as clinically indicated and appropriate.  Patient/parent/caregiver has been advised to [facility] or [facility]CP if no better; to PCP or the Emergency Department if new signs and symptoms develop, or if the current signs or symptoms continue to change or worsen for further workup, evaluation and treatment as clinically indicated and appropriate  The patient will follow up with their current PCP if and as advised. If the patient does not currently have a PCP we will assist them in obtaining one.   The patient may need specialty follow up if the symptoms continue, in spite of conservative treatment and management, for further workup, evaluation, consultation and treatment as clinically indicated and appropriate.   Patient/parent/caregiver verbalized understanding and agreement of plan as discussed.  All questions were addressed during visit.  Please see discharge instructions below for further details of plan.  Discharge Instructions:   Discharge Instructions      You received an injection of Zofran 8 mg during her visit today.  This should significantly reduce your nausea and allows you to rehydrate with electrolyte replacement fluids such as Powerade, Gatorade or Pedialyte.  Recommend that you try to consume at least 60 ounces of electrolyte replacement fluid today.  When you begin to feel like you want to try solid foods, please do not eat anything other than saltine crackers, rice or chicken noodle soup for a full 24 hours.  After you have gone 24 hours with no vomiting and no diarrhea, continue this bland diet for another full 24 hours then resume simple, nongreasy, nonspicy, low acid foods in small quantities.  Is a prescription for Zofran and Imodium to your pharmacy.  Please take Zofran on a scheduled basis every 8 hours for the next 2 to 3 days or until your nausea is completely resolved.  Please take Imodium every time you have an episode of diarrhea.  You may actually notice  that the Imodium causes to be little constipated, this is because you really have not had a lot of food and it may take several days before you have a meaningful, normal bowel movement.  You may also have gas pain which you can continue using Gas-X for.  I do not believe that the Gas-X caused you to vomit initially I believe having gastroenteritis did.  If your leg cramps worsen or you begin to notice that you are having palpitations, your electrolytes may be out of balance to the point that you are now having systemic pathology.  The to go to the emergency room to have your electrolytes checked and repleted if needed.  Please speak with your primary care provider regarding testing for H. pylori.  I did renew a prescription for PPI for you.  I noticed that the dose of omeprazole that they were giving you was actually the over-the-counter dose and likely not as effective as it could be.  The standard of care for treatment of H. pylori infection is actually Prevacid so that is what I sent to your pharmacy.  Please take 1 tablet daily either in the morning or the evening, it really makes no difference but you should consider taking it with a meal.  Thank you for visiting urgent care today.  Please let us know if you are not feeling any better  in the next 3 to 5 days.    This office note has been dictated using Teaching laboratory technician.  Unfortunately, and despite my best efforts, this method of dictation can sometimes lead to occasional typographical or grammatical errors.  I apologize in advance if this occurs.     Theadora Rama Scales, [place] 07/06/21 1928

## 2021-07-06 NOTE — Discharge Instructions (Addendum)
You received an injection of Zofran 8 mg during her visit today.  This should significantly reduce your nausea and allows you to rehydrate with electrolyte replacement fluids such as Powerade, Gatorade or Pedialyte.  Recommend that you try to consume at least 60 ounces of electrolyte replacement fluid today.  When you begin to feel like you want to try solid foods, please do not eat anything other than saltine crackers, rice or chicken noodle soup for a full 24 hours.  After you have gone 24 hours with no vomiting and no diarrhea, continue this bland diet for another full 24 hours then resume simple, nongreasy, nonspicy, low acid foods in small quantities.  Is a prescription for Zofran and Imodium to your pharmacy.  Please take Zofran on a scheduled basis every 8 hours for the next 2 to 3 days or until your nausea is completely resolved.  Please take Imodium every time you have an episode of diarrhea.  You may actually notice that the Imodium causes to be little constipated, this is because you really have not had a lot of food and it may take several days before you have a meaningful, normal bowel movement.  You may also have gas pain which you can continue using Gas-X for.  I do not believe that the Gas-X caused you to vomit initially I believe having gastroenteritis did.  If your leg cramps worsen or you begin to notice that you are having palpitations, your electrolytes may be out of balance to the point that you are now having systemic pathology.  The to go to the emergency room to have your electrolytes checked and repleted if needed.  Please speak with your primary care provider regarding testing for H. pylori.  I did renew a prescription for PPI for you.  I noticed that the dose of omeprazole that they were giving you was actually the over-the-counter dose and likely not as effective as it could be.  The standard of care for treatment of H. pylori infection is actually Prevacid so that is what I  sent to your pharmacy.  Please take 1 tablet daily either in the morning or the evening, it really makes no difference but you should consider taking it with a meal.  Thank you for visiting urgent care today.  Please let us know if you are not feeling any better in the next 3 to 5 days.

## 2021-07-06 NOTE — ED Triage Notes (Signed)
C/o nausea/vomiting and diarrhea since Thursday. Vomited times 3 in the past 24 hours. Diarrhea times 20 in the past 24 hours. Pt states that he is having cramping in his abd and legs. Pt states that his burps smell like sulfa or rotten eggs.

## 2022-04-21 ENCOUNTER — Ambulatory Visit (LOCAL_COMMUNITY_HEALTH_CENTER): Payer: Self-pay

## 2022-04-21 DIAGNOSIS — Z111 Encounter for screening for respiratory tuberculosis: Secondary | ICD-10-CM

## 2022-04-24 ENCOUNTER — Ambulatory Visit (LOCAL_COMMUNITY_HEALTH_CENTER): Payer: Self-pay

## 2022-04-24 DIAGNOSIS — Z111 Encounter for screening for respiratory tuberculosis: Secondary | ICD-10-CM

## 2022-04-24 LAB — TB SKIN TEST
Induration: 0 mm
TB Skin Test: NEGATIVE

## 2023-03-05 ENCOUNTER — Other Ambulatory Visit: Payer: Self-pay

## 2023-03-05 ENCOUNTER — Emergency Department: Payer: Self-pay

## 2023-03-05 ENCOUNTER — Emergency Department
Admission: EM | Admit: 2023-03-05 | Discharge: 2023-03-05 | Disposition: A | Discharge status: Home / Self Care | Payer: Self-pay | Attending: Student in an Organized Health Care Education/Training Program | Admitting: Student in an Organized Health Care Education/Training Program

## 2023-03-05 ENCOUNTER — Encounter: Payer: Self-pay | Admitting: Emergency Medicine

## 2023-03-05 ENCOUNTER — Emergency Department
Admit: 2023-03-05 | Discharge: 2023-03-05 | Disposition: A | Discharge status: Home / Self Care | Payer: Self-pay | Attending: Student in an Organized Health Care Education/Training Program | Admitting: Student in an Organized Health Care Education/Training Program

## 2023-03-05 DIAGNOSIS — M25512 Pain in left shoulder: Secondary | ICD-10-CM

## 2023-03-05 DIAGNOSIS — R9431 Abnormal electrocardiogram [ECG] [EKG]: Secondary | ICD-10-CM

## 2023-03-05 LAB — TROPONIN I (HIGH SENSITIVITY)
Troponin I (High Sensitivity): 18 ng/L — ABNORMAL HIGH (ref <18)
Troponin I (High Sensitivity): 17 ng/L (ref <18)
Troponin I (High Sensitivity): 14 ng/L (ref <18)

## 2023-03-05 LAB — CBC WITH DIFFERENTIAL/PLATELET
Abs Immature Granulocytes: 0.01 10*3/uL (ref 0.00–0.07)
Basophils Absolute: 0 10*3/uL (ref 0.0–0.1)
Basophils Relative: 0 %
Eosinophils Absolute: 0.2 10*3/uL (ref 0.0–0.5)
Eosinophils Relative: 3 %
HCT: 40.2 % (ref 39.0–52.0)
Hemoglobin: 14.1 g/dL (ref 13.0–17.0)
Immature Granulocytes: 0 %
Lymphocytes Relative: 42 %
Lymphs Abs: 2.2 10*3/uL (ref 0.7–4.0)
MCH: 34.3 pg — ABNORMAL HIGH (ref 26.0–34.0)
MCHC: 35.1 g/dL (ref 30.0–36.0)
MCV: 97.8 fL (ref 80.0–100.0)
Monocytes Absolute: 0.4 10*3/uL (ref 0.1–1.0)
Monocytes Relative: 8 %
Neutro Abs: 2.4 10*3/uL (ref 1.7–7.7)
Neutrophils Relative %: 47 %
Platelets: 205 10*3/uL (ref 150–400)
RBC: 4.11 MIL/uL — ABNORMAL LOW (ref 4.22–5.81)
RDW: 11.9 % (ref 11.5–15.5)
WBC: 5.2 10*3/uL (ref 4.0–10.5)
nRBC: 0 % (ref 0.0–0.2)

## 2023-03-05 LAB — ECHOCARDIOGRAM COMPLETE
AR max vel: 2.47 cm2
AV Area VTI: 2.36 cm2
AV Area mean vel: 2.31 cm2
AV Mean grad: 3 mm[Hg]
AV Peak grad: 5.1 mm[Hg]
Ao pk vel: 1.13 m/s
Area-P 1/2: 5.06 cm2
Calc EF: 52.3 %
Height: 66 in
MV VTI: 2.58 cm2
S' Lateral: 3 cm
Single Plane A2C EF: 43.8 %
Single Plane A4C EF: 58.4 %
Weight: 3280 [oz_av]

## 2023-03-05 LAB — COMPREHENSIVE METABOLIC PANEL
ALT: 22 U/L (ref 0–44)
AST: 19 U/L (ref 15–41)
Albumin: 3.3 g/dL — ABNORMAL LOW (ref 3.5–5.0)
Alkaline Phosphatase: 69 U/L (ref 38–126)
Anion gap: 10 (ref 5–15)
BUN: 14 mg/dL (ref 6–20)
CO2: 25 mmol/L (ref 22–32)
Calcium: 8.4 mg/dL — ABNORMAL LOW (ref 8.9–10.3)
Chloride: 107 mmol/L (ref 98–111)
Creatinine, Ser: 1.19 mg/dL (ref 0.61–1.24)
GFR, Estimated: >60 mL/min (ref >60)
Glucose, Bld: 86 mg/dL (ref 70–99)
Potassium: 3.3 mmol/L — ABNORMAL LOW (ref 3.5–5.1)
Sodium: 142 mmol/L (ref 135–145)
Total Bilirubin: 1 mg/dL (ref 0.0–1.2)
Total Protein: 6.8 g/dL (ref 6.5–8.1)

## 2023-03-05 MED ORDER — TRAMADOL HCL 50 MG PO TABS
50.0000 mg | ORAL_TABLET | Freq: Once | ORAL | Status: AC
  Administered 2023-03-05: 50 mg via ORAL
  Filled 2023-03-05: qty 1

## 2023-03-05 MED ORDER — CYCLOBENZAPRINE HCL 10 MG PO TABS: 10.0000 mg | ORAL_TABLET | Freq: Three times a day (TID) | ORAL | 0 refills | Status: AC | PRN

## 2023-03-05 MED ORDER — DIAZEPAM 5 MG PO TABS
5.0000 mg | ORAL_TABLET | Freq: Once | ORAL | Status: AC
  Administered 2023-03-05: 5 mg via ORAL
  Filled 2023-03-05: qty 1

## 2023-03-05 MED ORDER — TRAMADOL HCL 50 MG PO TABS: 50.0000 mg | ORAL_TABLET | Freq: Four times a day (QID) | ORAL | 0 refills | Status: AC | PRN

## 2023-03-05 MED ORDER — ONDANSETRON 4 MG PO TBDP
4.0000 mg | ORAL_TABLET | Freq: Once | ORAL | Status: AC
  Administered 2023-03-05: 4 mg via ORAL
  Filled 2023-03-05: qty 1

## 2023-03-05 NOTE — ED Provider Notes (Signed)
[facility] Provider Note    Event Date/Time   First MD Initiated Contact with Patient 03/05/23 [phone]     (approximate)   History   Arm Pain   HPI  Cody Johnson is a 48 y.o. male presents the ER for evaluation of left anterior shoulder pain over the past 2448 hrs.  Today having more significant pain with movement of the left shoulder.  Denies any specific injury.  Primarily works at [employer].  Denies any fevers or chills.  No shortness of breath no pleuritic pain.  No discomfort at rest.  No numbness or tingling going down the left arm.     Physical Exam   Triage Vital Signs: ED Triage Vitals  Encounter Vitals Group     BP 03/05/23 0330 (!) 136/102     Systolic BP Percentile --      Diastolic BP Percentile --      Pulse Rate 03/05/23 0330 76     Resp 03/05/23 0330 20     Temp 03/05/23 0330 97.6 F (36.4 C)     Temp Source 03/05/23 0330 Oral     SpO2 03/05/23 0330 98 %     Weight 03/05/23 0326 205 lb (93 kg)     Height 03/05/23 0326 5\' 6"  (1.676 m)     Head Circumference --      Peak Flow --      Pain Score 03/05/23 0326 8     Pain Loc --      Pain Education --      Exclude from Growth Chart --     Most recent vital signs: Vitals:   03/05/23 0933 03/05/23 1030  BP: (!) 145/110 (!) 163/141  Pulse: 66 68  Resp: 18 18  Temp: 98.2 F (36.8 C)   SpO2: 99% 99%     Constitutional: Alert  Eyes: Conjunctivae are normal.  Head: Atraumatic. Nose: No congestion/rhinnorhea. Mouth/Throat: Mucous membranes are moist.   Neck: Painless ROM.  Cardiovascular:   Good peripheral circulation. Respiratory: Normal respiratory effort.  No retractions.  Gastrointestinal: Soft and nontender.  Musculoskeletal:  no deformity.  Tenderness to palpation along the anterior left shoulder.  No deformity noted.  Neurovascular intact distally.  Pain reproduced with abduction of the left arm both anterior and laterally.  Pain with palpation over the anterior  component of the deltoid.  No posterior pain. Neurologic:  MAE spontaneously. No gross focal neurologic deficits are appreciated.  Skin:  Skin is warm, dry and intact. No rash noted. Psychiatric: Mood and affect are normal. Speech and behavior are normal.    ED Results / Procedures / Treatments   Labs (all labs ordered are listed, but only abnormal results are displayed) Labs Reviewed  CBC WITH DIFFERENTIAL/PLATELET - Abnormal; Notable for the following components:      Result Value   RBC 4.11 (*)    MCH 34.3 (*)    All other components within normal limits  COMPREHENSIVE METABOLIC PANEL - Abnormal; Notable for the following components:   Potassium 3.3 (*)    Calcium 8.4 (*)    Albumin 3.3 (*)    All other components within normal limits  TROPONIN I (HIGH SENSITIVITY) - Abnormal; Notable for the following components:   Troponin I (High Sensitivity) 18 (*)    All other components within normal limits  TROPONIN I (HIGH SENSITIVITY)  TROPONIN I (HIGH SENSITIVITY)     EKG  ED ECG REPORT I, Willy Eddy, the attending  physician, personally viewed and interpreted this ECG.   Date: 03/05/2023  EKG Time: 3:27  Rate: 75  Rhythm: sinus  Axis: normal  Intervals: normal qt  ST&T Change: deep antero lateral t wave inversions, no stemi    RADIOLOGY Please see ED Course for my review and interpretation.  I personally reviewed all radiographic images ordered to evaluate for the above acute complaints and reviewed radiology reports and findings.  These findings were personally discussed with the patient.  Please see medical record for radiology report.    PROCEDURES:  Critical Care performed: No  Procedures   MEDICATIONS ORDERED IN ED: Medications  traMADol (ULTRAM) tablet 50 mg (50 mg Oral Given 03/05/23 0919)  ondansetron (ZOFRAN-ODT) disintegrating tablet 4 mg (4 mg Oral Given 03/05/23 1041)  diazepam (VALIUM) tablet 5 mg (5 mg Oral Given 03/05/23 1054)      IMPRESSION / MDM / ASSESSMENT AND PLAN / ED COURSE  I reviewed the triage vital signs and the nursing notes.                              Differential diagnosis includes, but is not limited to, msk strain, arthritis, frozen shoulder, rotator cuff injury, acs, ptx, chf  Patient presenting to the ER for evaluation of symptoms as described above.  Based on symptoms, risk factors and considered above differential, this presenting complaint could reflect a potentially life-threatening illness therefore the patient will be placed on continuous pulse oximetry and telemetry for monitoring.  Laboratory evaluation will be sent to evaluate for the above complaints.     Clinical Course as of 03/05/23 1422  Fri Mar 05, 2023  4166 Discussed the case in consultation with Dr. Juliann Pares of cardiology who is reviewed the EKG and has recommended echocardiogram stat to further evaluate.  Felt to be less likely ACS or acute ischemic pattern given normal enzymes but will repeat for third enzyme. [PR]  1421 Third enzyme is negative.  Has been evaluated by cardiology at bedside.  Patient does appear stable and appropriate for outpatient follow-up  Ptient agreeable with plan. [PR]    Clinical Course User Index [PR] Willy Eddy, MD     FINAL CLINICAL IMPRESSION(S) / ED DIAGNOSES   Final diagnoses:  Acute pain of left shoulder  Abnormal EKG     Rx / DC Orders   ED Discharge Orders          Ordered    traMADol (ULTRAM) 50 MG tablet  Every 6 hours PRN        03/05/23 0816    cyclobenzaprine (FLEXERIL) 10 MG tablet  3 times daily PRN        03/05/23 0816             Note:  This document was prepared using Dragon voice recognition software and may include unintentional dictation errors.    Willy Eddy, MD 03/05/23 [id number]

## 2023-03-05 NOTE — ED Triage Notes (Signed)
Patient ambulatory to triage with steady gait, without difficulty or distress noted; pt reports awoke at midnight with pain to left shoulder and took tylenol; awoke again at 2am with persistent pain; anterior shoulder pain with palpation, pain with ROM; denies any injury; st pain radiating down upper arm; pt also reports frontal HA since yesterday

## 2023-03-05 NOTE — Progress Notes (Signed)
*  PRELIMINARY RESULTS* Echocardiogram 2D Echocardiogram has been performed.  Cody Johnson 03/05/2023, 12:14 PM

## 2023-03-05 NOTE — ED Notes (Signed)
Echocardiogram in process at this time.  

## 2023-03-05 NOTE — Consult Note (Incomplete)
CARDIOLOGY CONSULT NOTE               Patient ID: Cody Johnson MRN: [id number] DOB/AGE: May 25, 1975 48 y.o.  Admit date: 03/05/2023 Referring Physician *** Primary Physician *** Primary Cardiologist *** Reason for Consultation ***  HPI: ***  Review of systems complete and found to be negative unless listed above     Past Medical History:  Diagnosis Date   Hypertension     History reviewed. No pertinent surgical history.  (Not in a hospital admission)  Social History   Socioeconomic History   Marital status: Single    Spouse name: Not on file   Number of children: Not on file   Years of education: Not on file   Highest education level: Not on file  Occupational History   Not on file  Tobacco Use   Smoking status: Every Day    Current packs/day: 0.25    Types: Cigarettes   Smokeless tobacco: Never  Vaping Use   Vaping status: Never Used  Substance and Sexual Activity   Alcohol use: Yes    Comment: two times per week   Drug use: Yes    Types: Marijuana   Sexual activity: Not on file  Other Topics Concern   Not on file  Social History Narrative   Not on file   Social Drivers of Health   Financial Resource Strain: Not on file  Food Insecurity: No Food Insecurity (04/16/2021)   Received from [facility], [facility]   Hunger Vital Sign    Worried About Running Out of Food in the Last Year: Never true    Ran Out of Food in the Last Year: Never true  Transportation Needs: Not on file  Physical Activity: Not on file  Stress: Not on file  Social Connections: Not on file  Intimate Partner Violence: Not on file    History reviewed. No pertinent family history.    Review of systems complete and found to be negative unless listed above      PHYSICAL EXAM  General: Well developed, well nourished, in no acute distress HEENT:  Normocephalic and atramatic Neck:  No JVD.  Lungs: Clear bilaterally to auscultation  and percussion. Heart: HRRR . Normal S1 and S2 without gallops or murmurs.  Abdomen: Bowel sounds are positive, abdomen soft and non-tender  Msk:  Back normal, normal gait. Normal strength and tone for age. Extremities: No clubbing, cyanosis or edema.   Neuro: Alert and oriented X 3. Psych:  Good affect, responds appropriately  Labs:   Lab Results  Component Value Date   WBC 5.2 03/05/2023   HGB 14.1 03/05/2023   HCT 40.2 03/05/2023   MCV 97.8 03/05/2023   PLT 205 03/05/2023    Recent Labs  Lab 03/05/23 0331  NA 142  K 3.3*  CL 107  CO2 25  BUN 14  CREATININE 1.19  CALCIUM 8.4*  PROT 6.8  BILITOT 1.0  ALKPHOS 69  ALT 22  AST 19  GLUCOSE 86   No results found for: "CKTOTAL", "CKMB", "CKMBINDEX", "TROPONINI" No results found for: "CHOL" No results found for: "HDL" No results found for: "LDLCALC" No results found for: "TRIG" No results found for: "CHOLHDL" No results found for: "LDLDIRECT"    Radiology: DG Chest Portable 1 View Result Date: 03/05/2023 CLINICAL DATA:  Shoulder pain and abnormal EKG EXAM: PORTABLE CHEST 1 VIEW COMPARISON:  Chest radiograph dated 02/01/2020 FINDINGS: Decreased lung volumes. Patchy right basilar opacity.  No pleural effusion or pneumothorax. Enlarged cardiomediastinal silhouette is likely projectional. No acute osseous abnormality. IMPRESSION: 1. Decreased lung volumes with patchy right basilar opacity, likely atelectasis. Aspiration or pneumonia can be considered in the appropriate clinical setting. 2. No radiographic finding of pulmonary edema. Electronically Signed   By: Agustin Cree M.D.   On: 03/05/2023 09:00   DG Shoulder Left Result Date: 03/05/2023 CLINICAL DATA:  48 year old male with history of left shoulder pain. EXAM: LEFT SHOULDER - 2+ VIEW COMPARISON:  Left shoulder radiograph 04/02/2018. FINDINGS: Three views of the left shoulder demonstrate no acute displaced fracture, subluxation or dislocation. There is some joint space narrowing,  subchondral sclerosis and subchondral cyst formation in the left glenohumeral joint indicative of mild osteoarthritis. IMPRESSION: 1. No acute radiographic abnormality of the left shoulder. Electronically Signed   By: Trudie Reed M.D.   On: 03/05/2023 05:43    EKG: ***  ASSESSMENT AND PLAN:  ***  Signed: Alwyn Pea MD, PHD, [facility] 03/05/2023, 1:44 PM

## 2023-05-03 IMAGING — CT CT ABD-PELV W/ CM
2 of 5 series · 16 of 46 positions shown, 18 images · IV contrast (Omnipaque)
Comparison: None.

CLINICAL DATA: Left lower quadrant abdominal pain. Rectal pressure
and urinary urgency.

EXAM:
CT ABDOMEN AND PELVIS WITH CONTRAST
TECHNIQUE: Multidetector CT imaging of the abdomen and pelvis was performed
using the standard protocol following bolus administration of
intravenous contrast.
CONTRAST:  100mL OMNIPAQUE IOHEXOL 300 MG/ML  SOLN

[Series 2: axial st · axial · 0.89mm/px · z∈[-544,-114]mm · 13 of 96 slices shown, 15 images]
[im 6/96  soft-tissue]
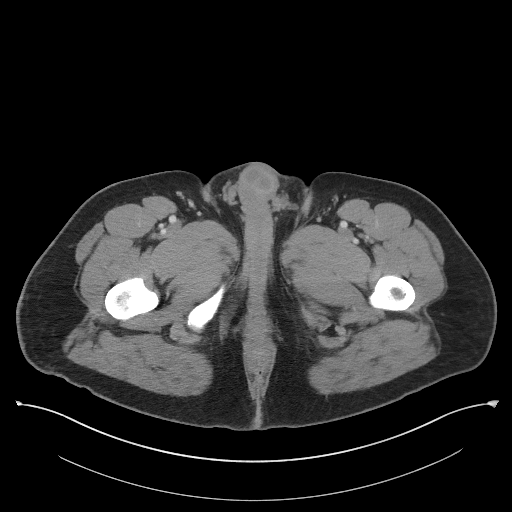
[im 6/96  bone]
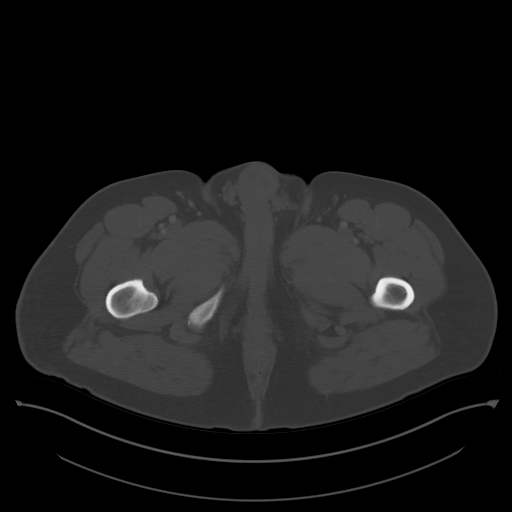
[im 16/96  soft-tissue]
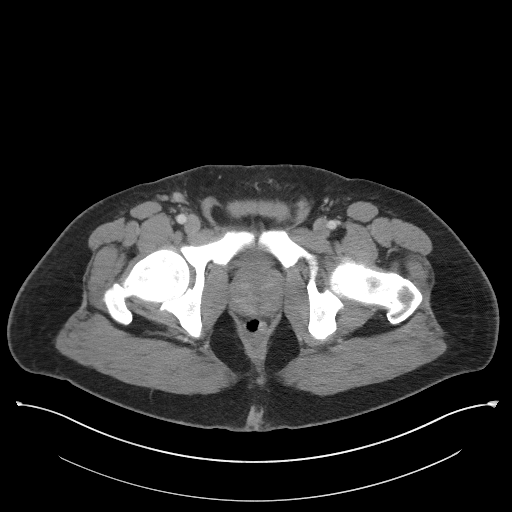
[im 21/96  soft-tissue]
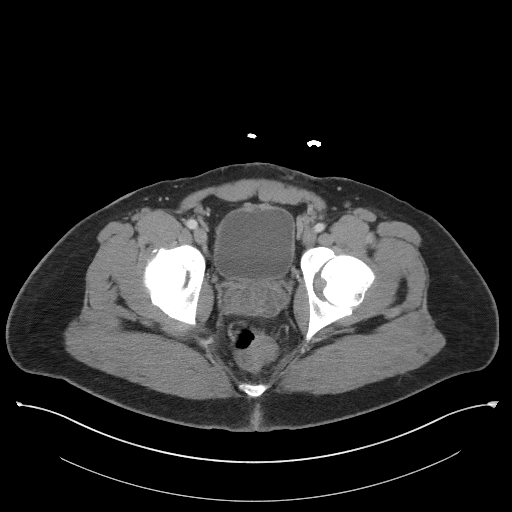
[im 26/96  soft-tissue]
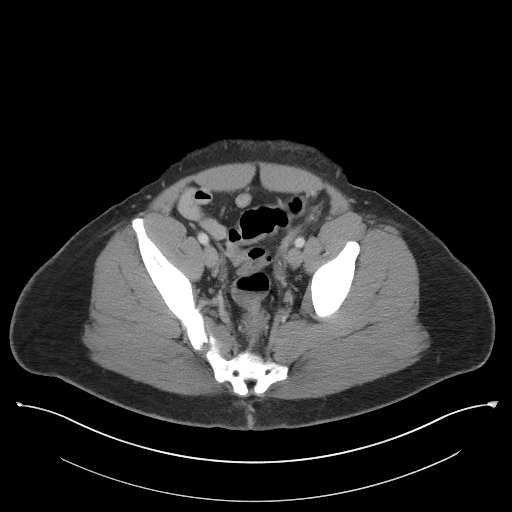
[im 36/96  soft-tissue]
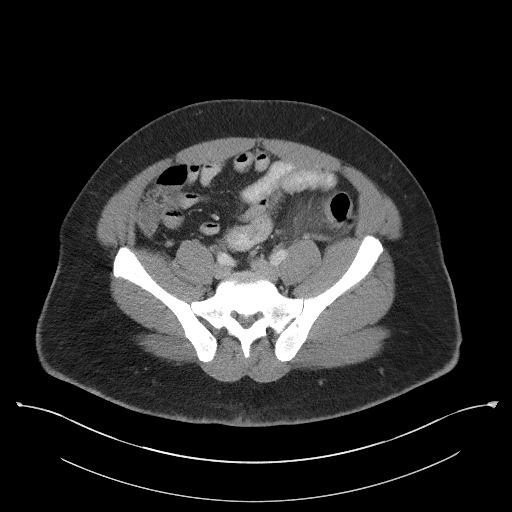
[im 41/96  soft-tissue]
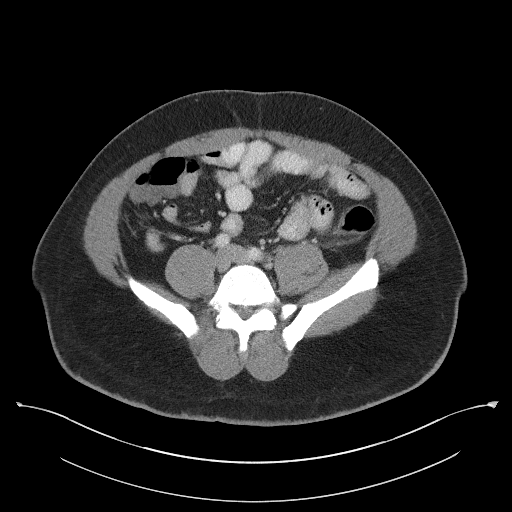
[im 51/96  soft-tissue]
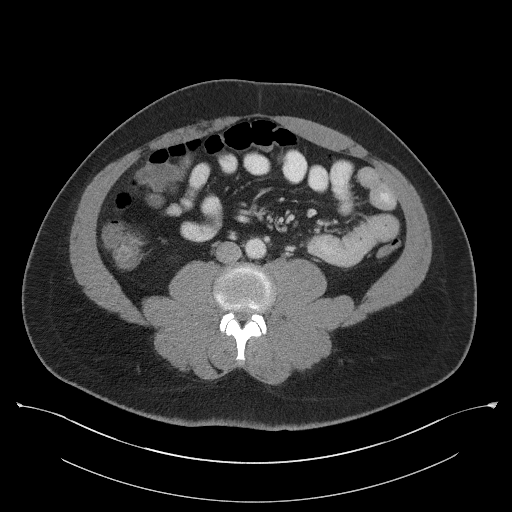
[im 56/96  soft-tissue]
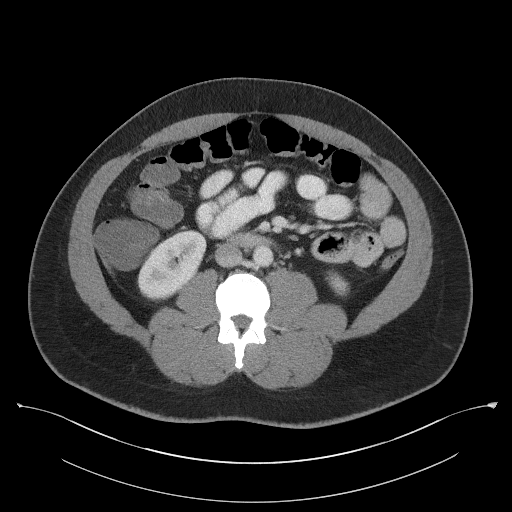
[im 61/96  soft-tissue]
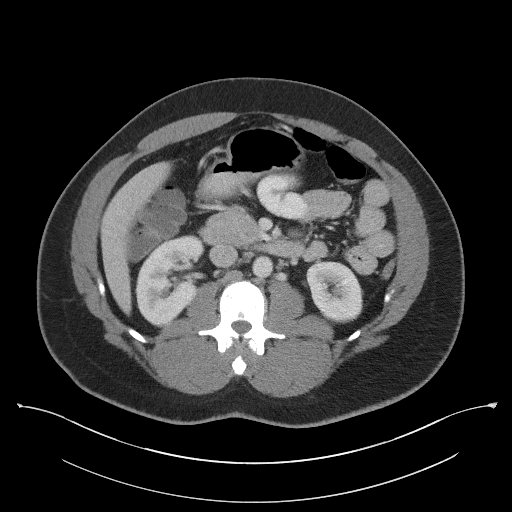
[im 61/96  bone]
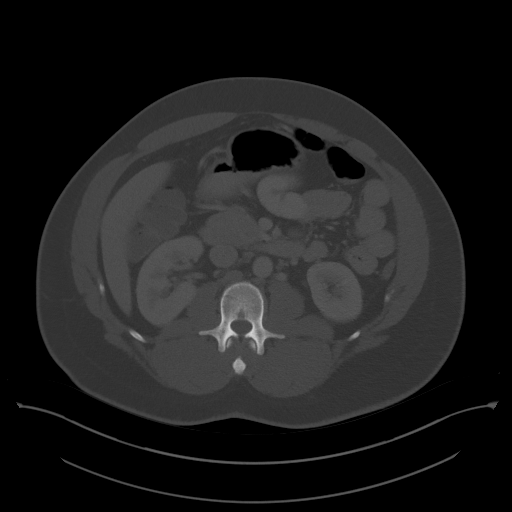
[im 71/96  soft-tissue]
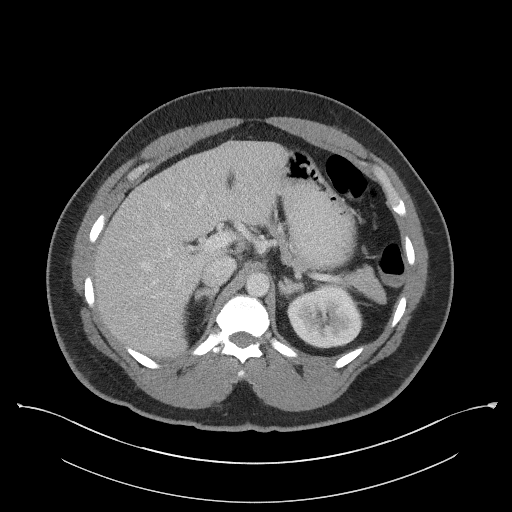
[im 76/96  soft-tissue]
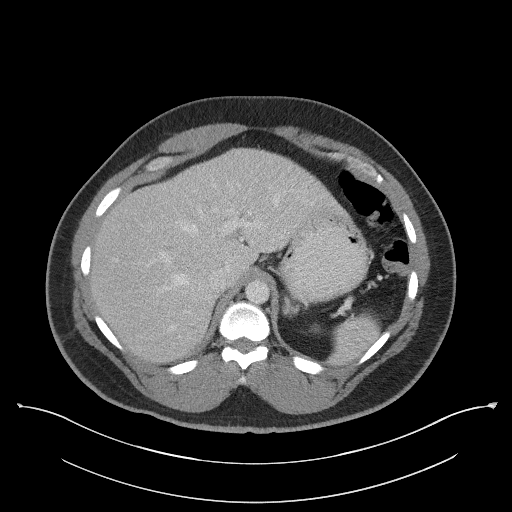
[im 81/96  soft-tissue]
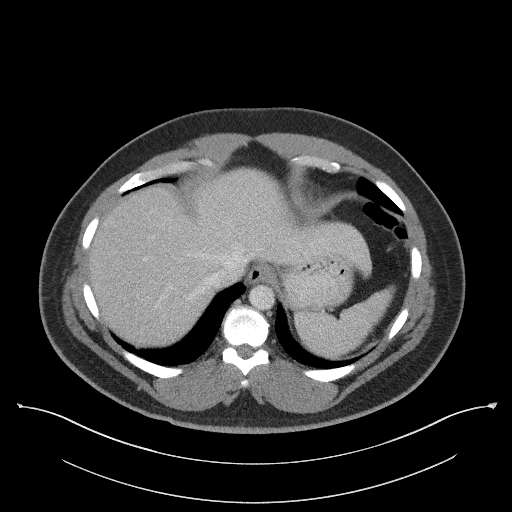
[im 91/96  soft-tissue]
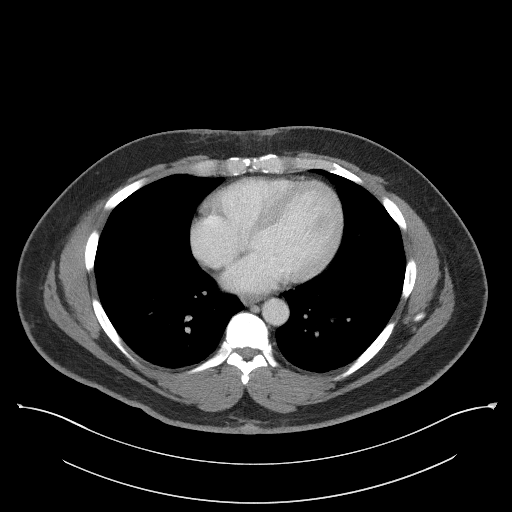

[Series 5: coronal st · coronal · 0.80mm/px · 3 of 111 slices shown]
[im 37/111  soft-tissue]
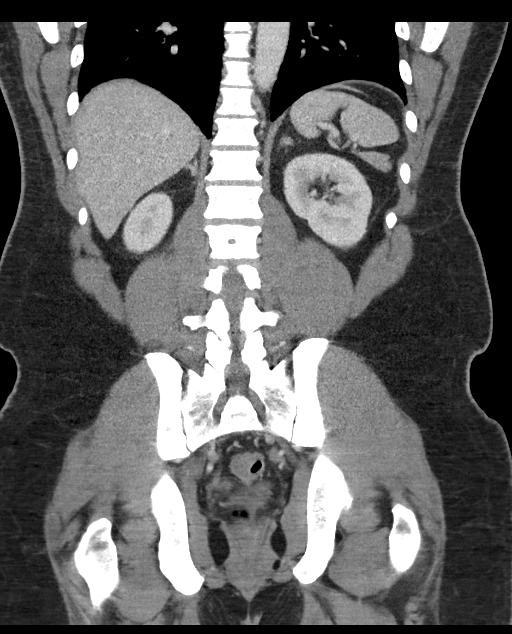
[im 49/111  soft-tissue]
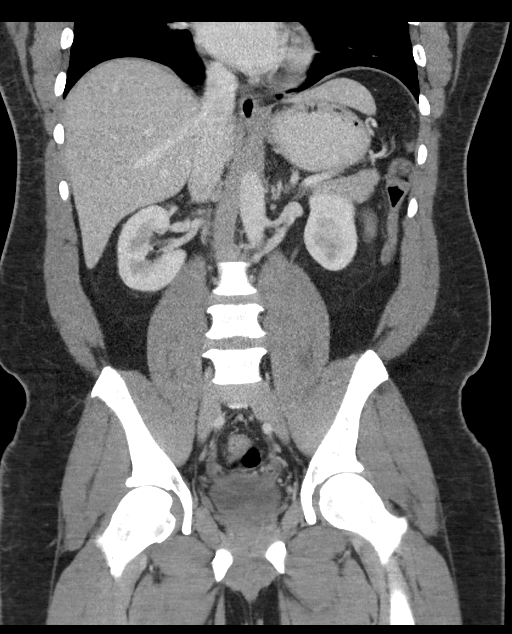
[im 62/111  soft-tissue]
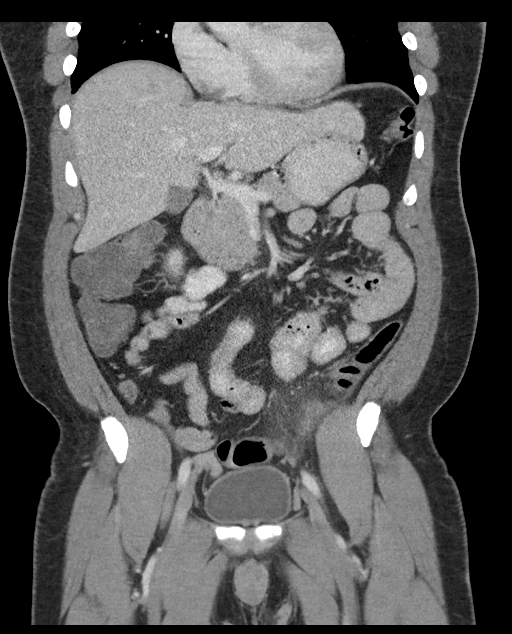

[16 of 46 positions shown; findings below may reference images not displayed]

FINDINGS: Lower chest: No acute abnormality.

Hepatobiliary: No focal liver abnormality is seen. No gallstones,
gallbladder wall thickening, or biliary dilatation.

Pancreas: Unremarkable. No pancreatic ductal dilatation or
surrounding inflammatory changes.

Spleen: Normal in size without focal abnormality.

Adrenals/Urinary Tract: Adrenal glands are unremarkable. Kidneys are
normal, without renal calculi, focal lesion, or hydronephrosis.
Bladder is unremarkable.

Stomach/Bowel: Stomach normal. The appendix is visualized and is
unremarkable. No small bowel wall thickening, inflammation or
distension. There is wall thickening with surrounding inflammatory
fat stranding involving the proximal sigmoid colon with epicenter
around a small inflamed diverticula. Imaging findings compatible
with acute diverticulitis. There is no significant free fluid. No
fluid collection identified. No pneumoperitoneum identified.

Vascular/Lymphatic: No significant vascular findings are present.
Prominent bilateral inguinal lymph nodes are identified including 1
cm right inguinal lymph node, image 86/2. No enlarged abdominal or
iliac lymph nodes.

Reproductive: The prostate gland appears heterogeneous with focal
area of low attenuation within the posterior gland measuring 1.1 cm.
The seminal vesicles appear edematous bilaterally.

Other: No free fluid or fluid collections.

Musculoskeletal: No acute or significant osseous findings.
IMPRESSION: 1. Examination is positive for acute sigmoid diverticulitis. No
perforation or abscess identified.
2. Heterogeneous appearance of the prostate gland with focal area of
low attenuation within the posterior gland measuring 1.1 cm.
Correlate for any clinical signs or symptoms of prostatitis.
3. Borderline enlarged right inguinal lymph nodes are favored to
represent reactive adenopathy.

## 2023-09-15 ENCOUNTER — Ambulatory Visit
Admission: EM | Admit: 2023-09-15 | Discharge: 2023-09-15 | Disposition: A | Discharge status: Home / Self Care | Attending: Emergency Medicine | Admitting: Emergency Medicine

## 2023-09-15 ENCOUNTER — Encounter: Payer: Self-pay | Admitting: Emergency Medicine

## 2023-09-15 DIAGNOSIS — W57XXXA Bitten or stung by nonvenomous insect and other nonvenomous arthropods, initial encounter: Secondary | ICD-10-CM | POA: Diagnosis not present

## 2023-09-15 DIAGNOSIS — S90861A Insect bite (nonvenomous), right foot, initial encounter: Secondary | ICD-10-CM | POA: Diagnosis not present

## 2023-09-15 DIAGNOSIS — Z23 Encounter for immunization: Secondary | ICD-10-CM | POA: Diagnosis not present

## 2023-09-15 DIAGNOSIS — L03115 Cellulitis of right lower limb: Secondary | ICD-10-CM

## 2023-09-15 MED ORDER — SULFAMETHOXAZOLE-TRIMETHOPRIM 800-160 MG PO TABS: 1.0000 | ORAL_TABLET | Freq: Two times a day (BID) | ORAL | 0 refills | Status: AC

## 2023-09-15 MED ORDER — TETANUS-DIPHTH-ACELL PERTUSSIS 5-2.5-18.5 LF-MCG/0.5 IM SUSY
0.5000 mL | PREFILLED_SYRINGE | Freq: Once | INTRAMUSCULAR | Status: AC
  Administered 2023-09-15 (×2): 0.5 mL via INTRAMUSCULAR

## 2023-09-15 NOTE — Discharge Instructions (Addendum)
 Take Bactrim  as directed.  Your tetanus was updated today.    Follow up with your primary care provider tomorrow.  Go to the emergency department if you have worsening symptoms.

## 2023-09-15 NOTE — ED Provider Notes (Signed)
 [facility]    CSN: [id number] Arrival date & time: 09/15/23  1255      History   Chief Complaint No chief complaint on file.   HPI Cody Johnson is a 48 y.o. male.  Patient presents with pain, swelling, redness of his right foot x 2 days.  His symptoms started when he was putting on a pair of old shoes; he felt something sharp and believes he was bitten by a spider.  He did not see the spider.  No open wounds or drainage.  No fever or chills.  No numbness or weakness.  No OTC medications taken today.  Last tetanus unknown.   The history is provided by the patient and medical records.    Past Medical History:  Diagnosis Date   Hypertension     There are no active problems to display for this patient.   History reviewed. No pertinent surgical history.     Home Medications    Prior to Admission medications   Medication Sig Start Date End Date Taking? Authorizing Provider  sulfamethoxazole -trimethoprim  (BACTRIM  DS) 800-160 MG tablet Take 1 tablet by mouth 2 (two) times daily for 7 days. 09/15/23 09/22/23 Yes Corlis Burnard DEL, NP  cyclobenzaprine  (FLEXERIL ) 10 MG tablet Take 1 tablet (10 mg total) by mouth 3 (three) times daily as needed. 03/05/23   Lang Dover, MD  hydrochlorothiazide (HYDRODIURIL) 25 MG tablet Take 25 mg by mouth daily.    [provider]  lansoprazole  (PREVACID ) 30 MG capsule Take 1 capsule (30 mg total) by mouth in the morning. 30 minutes prior to breakfast meal 07/06/21 10/04/21  Joesph Shaver Scales, PA-C  lisinopril (PRINIVIL,ZESTRIL) 40 MG tablet Take 40 mg by mouth daily.    [provider]  loperamide  (IMODIUM  A-D) 2 MG tablet Take 2 tablets (4 mg total) by mouth 4 (four) times daily as needed for diarrhea or loose stools. 07/06/21   Joesph Shaver Scales, PA-C  ondansetron  (ZOFRAN -ODT) 8 MG disintegrating tablet Take 1 tablet (8 mg total) by mouth every 8 (eight) hours as needed for nausea or vomiting. 07/06/21   Joesph Shaver Scales, PA-C  traMADol  (ULTRAM ) 50 MG tablet Take 1 tablet (50 mg total) by mouth every 6 (six) hours as needed. 03/05/23 03/04/24  Lang Dover, MD    Family History History reviewed. No pertinent family history.  Social History Social History   Tobacco Use   Smoking status: Every Day    Current packs/day: 0.25    Types: Cigarettes   Smokeless tobacco: Never  Vaping Use   Vaping status: Never Used  Substance Use Topics   Alcohol use: Yes    Comment: two times per week   Drug use: Yes    Types: Marijuana     Allergies   Patient has no known allergies.   Review of Systems Review of Systems  Constitutional:  Negative for chills and fever.  Musculoskeletal:  Positive for arthralgias, gait problem and joint swelling.  Skin:  Positive for color change and wound.  Neurological:  Negative for weakness and numbness.     Physical Exam Triage Vital Signs ED Triage Vitals [09/15/23 1332]  Encounter Vitals Group     BP 121/81     Girls Systolic BP Percentile      Girls Diastolic BP Percentile      Boys Systolic BP Percentile      Boys Diastolic BP Percentile      Pulse Rate 78     Resp  18     Temp 97.8 F (36.6 C)     Temp src      SpO2 98 %     Weight      Height      Head Circumference      Peak Flow      Pain Score      Pain Loc      Pain Education      Exclude from Growth Chart    No data found.  Updated Vital Signs BP 121/81   Pulse 78   Temp 97.8 F (36.6 C)   Resp 18   SpO2 98%   Visual Acuity Right Eye Distance:   Left Eye Distance:   Bilateral Distance:    Right Eye Near:   Left Eye Near:    Bilateral Near:     Physical Exam Constitutional:      General: He is not in acute distress. HENT:     Mouth/Throat:     Mouth: Mucous membranes are moist.  Cardiovascular:     Rate and Rhythm: Normal rate and regular rhythm.  Pulmonary:     Effort: Pulmonary effort is normal. No respiratory distress.  Musculoskeletal:         General: Swelling and tenderness present. No deformity. Normal range of motion.     Comments: Moderate edema and mild erythema of right foot.  No open wounds.   Skin:    General: Skin is warm and dry.     Capillary Refill: Capillary refill takes less than 2 seconds.     Findings: Erythema present.  Neurological:     General: No focal deficit present.     Mental Status: He is alert.     Sensory: No sensory deficit.     Motor: No weakness.     Gait: Gait abnormal.     Comments: Limping gait.      UC Treatments / Results  Labs (all labs ordered are listed, but only abnormal results are displayed) Labs Reviewed - No data to display  EKG   Radiology No results found.  Procedures Procedures (including critical care time)  Medications Ordered in UC Medications  Tdap (BOOSTRIX ) injection 0.5 mL (has no administration in time range)    Initial Impression / Assessment and Plan / UC Course  I have reviewed the triage vital signs and the nursing notes.  Pertinent labs & imaging results that were available during my care of the patient were reviewed by me and considered in my medical decision making (see chart for details).    Cellulitis of right foot due to insect bite.  Afebrile and vital signs are stable.  Tetanus updated today.  Treating with Bactrim .  Education provided on cellulitis and insect bites.  Instructed patient to follow-up with his PCP tomorrow.  ED precautions given.  He agrees to plan of care.  Final Clinical Impressions(s) / UC Diagnoses   Final diagnoses:  Cellulitis of right foot  Insect bite of right foot, initial encounter     Discharge Instructions      Take Bactrim  as directed.  Your tetanus was updated today.    Follow up with your primary care provider tomorrow.  Go to the emergency department if you have worsening symptoms.        ED Prescriptions     Medication Sig Dispense Auth. Provider   sulfamethoxazole -trimethoprim  (BACTRIM  DS)  800-160 MG tablet Take 1 tablet by mouth 2 (two) times daily for 7 days. 14  tablet Corlis Burnard DEL, NP      PDMP not reviewed this encounter.   Corlis Burnard DEL, NP 09/15/23 1410
